# Patient Record
Sex: Female | Born: 1935 | Race: White | Hispanic: No
Health system: Southern US, Community
[De-identification: ages and names within clinical notes are randomized; demographics above are authoritative.]

---

## 2004-05-14 ENCOUNTER — Ambulatory Visit: Payer: Self-pay | Admitting: Radiation Oncology

## 2004-07-27 ENCOUNTER — Ambulatory Visit: Payer: Self-pay | Admitting: Oncology

## 2004-08-14 ENCOUNTER — Ambulatory Visit: Payer: Self-pay | Admitting: Oncology

## 2004-09-29 ENCOUNTER — Ambulatory Visit: Payer: Self-pay | Admitting: Radiation Oncology

## 2004-10-10 ENCOUNTER — Ambulatory Visit: Payer: Self-pay | Admitting: Internal Medicine

## 2004-11-08 ENCOUNTER — Ambulatory Visit: Payer: Self-pay | Admitting: Internal Medicine

## 2004-11-08 ENCOUNTER — Ambulatory Visit: Payer: Self-pay | Admitting: Oncology

## 2004-11-11 ENCOUNTER — Ambulatory Visit: Payer: Self-pay | Admitting: Internal Medicine

## 2004-11-12 ENCOUNTER — Ambulatory Visit: Payer: Self-pay | Admitting: Oncology

## 2005-02-08 ENCOUNTER — Ambulatory Visit: Payer: Self-pay | Admitting: Oncology

## 2005-02-11 ENCOUNTER — Ambulatory Visit: Payer: Self-pay | Admitting: Oncology

## 2005-06-07 ENCOUNTER — Ambulatory Visit: Payer: Self-pay | Admitting: Oncology

## 2005-06-14 ENCOUNTER — Ambulatory Visit: Payer: Self-pay | Admitting: Oncology

## 2005-09-22 ENCOUNTER — Ambulatory Visit: Payer: Self-pay | Admitting: Oncology

## 2005-09-23 ENCOUNTER — Other Ambulatory Visit: Payer: Self-pay

## 2005-09-23 ENCOUNTER — Inpatient Hospital Stay: Payer: Self-pay | Admitting: Psychiatry

## 2005-10-05 ENCOUNTER — Ambulatory Visit: Payer: Self-pay | Admitting: Oncology

## 2005-10-12 ENCOUNTER — Ambulatory Visit: Payer: Self-pay | Admitting: Oncology

## 2005-12-08 ENCOUNTER — Emergency Department: Payer: Self-pay | Admitting: Emergency Medicine

## 2005-12-08 ENCOUNTER — Other Ambulatory Visit: Payer: Self-pay

## 2006-01-22 ENCOUNTER — Ambulatory Visit: Payer: Self-pay | Admitting: Internal Medicine

## 2006-02-22 ENCOUNTER — Ambulatory Visit: Payer: Self-pay | Admitting: Oncology

## 2006-03-14 ENCOUNTER — Ambulatory Visit: Payer: Self-pay | Admitting: Oncology

## 2006-04-25 ENCOUNTER — Ambulatory Visit: Payer: Self-pay | Admitting: Internal Medicine

## 2006-08-21 ENCOUNTER — Ambulatory Visit: Payer: Self-pay | Admitting: Oncology

## 2006-09-14 ENCOUNTER — Ambulatory Visit: Payer: Self-pay | Admitting: Oncology

## 2006-10-16 ENCOUNTER — Other Ambulatory Visit: Payer: Self-pay

## 2006-10-16 ENCOUNTER — Emergency Department: Payer: Self-pay | Admitting: Emergency Medicine

## 2006-10-24 ENCOUNTER — Ambulatory Visit: Payer: Self-pay | Admitting: Psychiatry

## 2006-11-13 ENCOUNTER — Ambulatory Visit: Payer: Self-pay | Admitting: Oncology

## 2006-11-23 ENCOUNTER — Ambulatory Visit: Payer: Self-pay | Admitting: Oncology

## 2006-11-28 ENCOUNTER — Ambulatory Visit: Payer: Self-pay | Admitting: Oncology

## 2006-12-13 ENCOUNTER — Ambulatory Visit: Payer: Self-pay | Admitting: Oncology

## 2007-03-15 ENCOUNTER — Ambulatory Visit: Payer: Self-pay | Admitting: Oncology

## 2007-05-15 ENCOUNTER — Ambulatory Visit: Payer: Self-pay | Admitting: Oncology

## 2007-06-05 ENCOUNTER — Ambulatory Visit: Payer: Self-pay | Admitting: Oncology

## 2007-06-15 ENCOUNTER — Ambulatory Visit: Payer: Self-pay | Admitting: Oncology

## 2007-06-19 ENCOUNTER — Ambulatory Visit: Payer: Self-pay | Admitting: Internal Medicine

## 2007-11-13 ENCOUNTER — Ambulatory Visit: Payer: Self-pay | Admitting: Oncology

## 2007-12-04 ENCOUNTER — Ambulatory Visit: Payer: Self-pay | Admitting: Oncology

## 2007-12-13 ENCOUNTER — Ambulatory Visit: Payer: Self-pay | Admitting: Oncology

## 2008-03-06 ENCOUNTER — Ambulatory Visit: Payer: Self-pay | Admitting: Cardiology

## 2008-03-13 ENCOUNTER — Ambulatory Visit: Payer: Self-pay | Admitting: Internal Medicine

## 2008-05-12 ENCOUNTER — Emergency Department: Payer: Self-pay | Admitting: Emergency Medicine

## 2008-05-12 ENCOUNTER — Other Ambulatory Visit: Payer: Self-pay

## 2008-05-14 ENCOUNTER — Ambulatory Visit: Payer: Self-pay | Admitting: Oncology

## 2008-06-11 ENCOUNTER — Ambulatory Visit: Payer: Self-pay | Admitting: Oncology

## 2008-06-14 ENCOUNTER — Ambulatory Visit: Payer: Self-pay | Admitting: Oncology

## 2009-05-14 ENCOUNTER — Inpatient Hospital Stay: Payer: Self-pay | Admitting: Internal Medicine

## 2009-09-25 ENCOUNTER — Inpatient Hospital Stay: Payer: Self-pay | Admitting: Internal Medicine

## 2010-03-02 ENCOUNTER — Ambulatory Visit: Payer: Self-pay | Admitting: Internal Medicine

## 2010-06-05 ENCOUNTER — Inpatient Hospital Stay: Payer: Self-pay | Admitting: *Deleted

## 2010-09-27 ENCOUNTER — Ambulatory Visit: Payer: Self-pay | Admitting: Internal Medicine

## 2011-09-28 ENCOUNTER — Emergency Department: Payer: Self-pay | Admitting: Emergency Medicine

## 2011-09-28 LAB — CBC
HCT: 38.7 % (ref 35.0–47.0)
HGB: 12.9 g/dL (ref 12.0–16.0)
MCH: 29.8 pg (ref 26.0–34.0)
MCHC: 33.2 g/dL (ref 32.0–36.0)
Platelet: 129 10*3/uL — ABNORMAL LOW (ref 150–440)
WBC: 14.2 10*3/uL — ABNORMAL HIGH (ref 3.6–11.0)

## 2011-09-28 LAB — COMPREHENSIVE METABOLIC PANEL
Alkaline Phosphatase: 78 U/L (ref 50–136)
Anion Gap: 4 — ABNORMAL LOW (ref 7–16)
Bilirubin,Total: 0.8 mg/dL (ref 0.2–1.0)
Calcium, Total: 8.9 mg/dL (ref 8.5–10.1)
Co2: 27 mmol/L (ref 21–32)
Creatinine: 0.95 mg/dL (ref 0.60–1.30)
EGFR (African American): >60
EGFR (Non-African Amer.): 58 — ABNORMAL LOW
Osmolality: 281 (ref 275–301)
Potassium: 4.9 mmol/L (ref 3.5–5.1)
SGOT(AST): 21 U/L (ref 15–37)
SGPT (ALT): 17 U/L

## 2011-09-28 LAB — TROPONIN I: Troponin-I: <0.02 ng/mL

## 2011-09-28 LAB — CK TOTAL AND CKMB (NOT AT ARMC)
CK, Total: 20 U/L — ABNORMAL LOW (ref 21–215)
CK-MB: <0.5 ng/mL — ABNORMAL LOW (ref 0.5–3.6)

## 2011-09-28 LAB — PROTIME-INR: INR: 1.9

## 2011-12-29 ENCOUNTER — Inpatient Hospital Stay: Payer: Self-pay | Admitting: Internal Medicine

## 2011-12-29 LAB — URINALYSIS, COMPLETE
Blood: NEGATIVE
Glucose,UR: NEGATIVE mg/dL (ref 0–75)
Ketone: NEGATIVE
Nitrite: NEGATIVE
Ph: 7 (ref 4.5–8.0)
Protein: NEGATIVE
RBC,UR: NONE SEEN /HPF (ref 0–5)
Specific Gravity: 1.013 (ref 1.003–1.030)
WBC UR: 1 /HPF (ref 0–5)

## 2011-12-29 LAB — CBC
HCT: 38.5 % (ref 35.0–47.0)
HGB: 12.8 g/dL (ref 12.0–16.0)
MCV: 89 fL (ref 80–100)
RDW: 13.9 % (ref 11.5–14.5)
WBC: 6.9 10*3/uL (ref 3.6–11.0)

## 2011-12-29 LAB — COMPREHENSIVE METABOLIC PANEL
Albumin: 3.3 g/dL — ABNORMAL LOW (ref 3.4–5.0)
Alkaline Phosphatase: 106 U/L (ref 50–136)
Anion Gap: 6 — ABNORMAL LOW (ref 7–16)
BUN: 14 mg/dL (ref 7–18)
Chloride: 109 mmol/L — ABNORMAL HIGH (ref 98–107)
Co2: 26 mmol/L (ref 21–32)
EGFR (African American): >60
EGFR (Non-African Amer.): 55 — ABNORMAL LOW
Glucose: 96 mg/dL (ref 65–99)
Osmolality: 282 (ref 275–301)
Potassium: 5.3 mmol/L — ABNORMAL HIGH (ref 3.5–5.1)
SGPT (ALT): 30 U/L
Sodium: 141 mmol/L (ref 136–145)
Total Protein: 6.9 g/dL (ref 6.4–8.2)

## 2011-12-29 LAB — TROPONIN I: Troponin-I: <0.02 ng/mL

## 2011-12-29 LAB — CK TOTAL AND CKMB (NOT AT ARMC): CK-MB: <0.5 ng/mL — ABNORMAL LOW (ref 0.5–3.6)

## 2011-12-29 LAB — PROTIME-INR
INR: 2.4
Prothrombin Time: 26.2 secs — ABNORMAL HIGH (ref 11.5–14.7)

## 2011-12-29 LAB — DIGOXIN LEVEL: Digoxin: 1.26 ng/mL

## 2011-12-29 LAB — PRO B NATRIURETIC PEPTIDE: B-Type Natriuretic Peptide: 3221 pg/mL — ABNORMAL HIGH (ref 0–450)

## 2011-12-29 LAB — LACTATE DEHYDROGENASE: LDH: 178 U/L (ref 84–246)

## 2011-12-30 LAB — CBC WITH DIFFERENTIAL/PLATELET
Eosinophil #: 0.2 10*3/uL (ref 0.0–0.7)
HCT: 33.8 % — ABNORMAL LOW (ref 35.0–47.0)
Lymphocyte %: 23.9 %
MCH: 29.9 pg (ref 26.0–34.0)
MCHC: 33.4 g/dL (ref 32.0–36.0)
MCV: 90 fL (ref 80–100)
Monocyte #: 0.5 x10 3/mm (ref 0.2–0.9)
Neutrophil #: 3.1 10*3/uL (ref 1.4–6.5)
Neutrophil %: 62.3 %
Platelet: 149 10*3/uL — ABNORMAL LOW (ref 150–440)
RBC: 3.78 10*6/uL — ABNORMAL LOW (ref 3.80–5.20)
RDW: 13.4 % (ref 11.5–14.5)
WBC: 4.9 10*3/uL (ref 3.6–11.0)

## 2011-12-30 LAB — COMPREHENSIVE METABOLIC PANEL
Anion Gap: 5 — ABNORMAL LOW (ref 7–16)
BUN: 14 mg/dL (ref 7–18)
Bilirubin,Total: 0.9 mg/dL (ref 0.2–1.0)
Co2: 27 mmol/L (ref 21–32)
EGFR (African American): >60
Glucose: 84 mg/dL (ref 65–99)
Osmolality: 283 (ref 275–301)
Potassium: 5 mmol/L (ref 3.5–5.1)
Sodium: 142 mmol/L (ref 136–145)

## 2011-12-30 LAB — LIPASE, BLOOD: Lipase: 433 U/L — ABNORMAL HIGH (ref 73–393)

## 2011-12-30 LAB — PROTIME-INR
INR: 2.2
Prothrombin Time: 24.4 secs — ABNORMAL HIGH (ref 11.5–14.7)

## 2011-12-31 LAB — BASIC METABOLIC PANEL
Anion Gap: 8 (ref 7–16)
BUN: 12 mg/dL (ref 7–18)
Calcium, Total: 8.2 mg/dL — ABNORMAL LOW (ref 8.5–10.1)
Chloride: 110 mmol/L — ABNORMAL HIGH (ref 98–107)
Creatinine: 0.88 mg/dL (ref 0.60–1.30)
EGFR (African American): >60
Sodium: 143 mmol/L (ref 136–145)

## 2011-12-31 LAB — CBC WITH DIFFERENTIAL/PLATELET
Basophil #: 0.1 10*3/uL (ref 0.0–0.1)
Basophil %: 1 %
Eosinophil #: 0.2 10*3/uL (ref 0.0–0.7)
HCT: 34.3 % — ABNORMAL LOW (ref 35.0–47.0)
HGB: 11.5 g/dL — ABNORMAL LOW (ref 12.0–16.0)
MCHC: 33.6 g/dL (ref 32.0–36.0)
MCV: 92 fL (ref 80–100)
Monocyte #: 0.4 x10 3/mm (ref 0.2–0.9)
Platelet: 158 10*3/uL (ref 150–440)
RBC: 3.72 10*6/uL — ABNORMAL LOW (ref 3.80–5.20)
RDW: 13.7 % (ref 11.5–14.5)
WBC: 5.2 10*3/uL (ref 3.6–11.0)

## 2011-12-31 LAB — LIPASE, BLOOD: Lipase: 118 U/L (ref 73–393)

## 2011-12-31 LAB — LIPID PANEL
Cholesterol: 102 mg/dL (ref 0–200)
HDL Cholesterol: 33 mg/dL — ABNORMAL LOW (ref 40–60)
Ldl Cholesterol, Calc: 56 mg/dL (ref 0–100)
Triglycerides: 65 mg/dL (ref 0–200)

## 2011-12-31 LAB — PROTIME-INR
INR: 2
Prothrombin Time: 22.9 secs — ABNORMAL HIGH (ref 11.5–14.7)

## 2012-01-01 LAB — CEA: CEA: 0.9 ng/mL (ref 0.0–4.7)

## 2012-05-14 DEATH — deceased

## 2013-05-22 IMAGING — CR DG CHEST 1V PORT
1 series · 1 of 1 positions shown · non-contrast
Comparison: none

REASON FOR EXAM: Chest Pain
COMMENTS:

[portable]
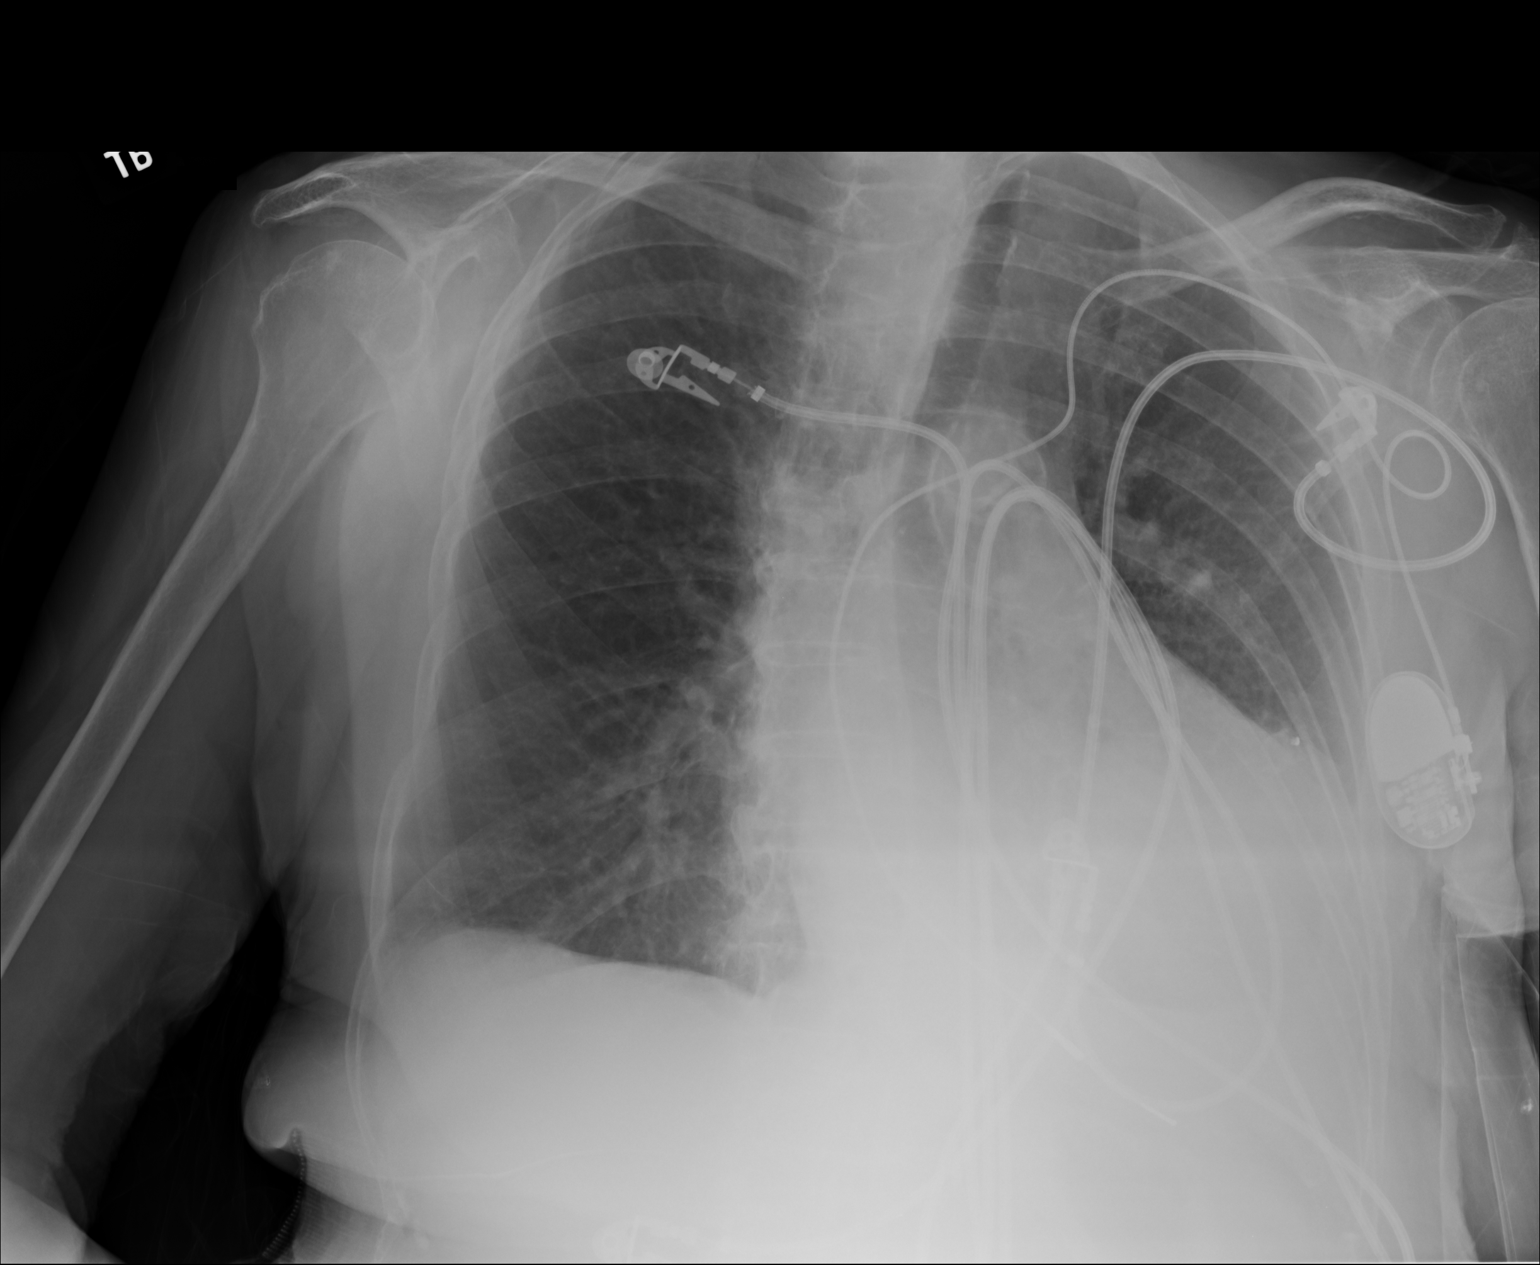

[1 of 1 positions shown; findings below may reference images not displayed]

PROCEDURE:     DXR - DXR PORTABLE CHEST SINGLE VIEW  - December 29, 2011  [DATE]

RESULT:     Comparison is made to the prior exam of the 09/28/2011.

The left hemidiaphragm is indistinct. The possibility of atelectasis or
infiltrate at the left base cannot be entirely excluded on the basis of this
exam. Follow-up is suggested if symptomatology persists. The right lung
field is clear. No acute changes of the pulmonary vasculature are
identified. A cardiac pacemaker is present. The heart size is upper limits
for normal.
IMPRESSION: 1.  There is loss of clarity of the left hemidiaphragm. The possibility of
infiltrate or atelectasis at the left base cannot be excluded. Follow-up PA
and lateral views are recommended if clinically indicated.
2.  The right lung field is clear.
3.  The heart size is upper limits for normal and is stable as compared to
the exam of 09/28/2011.

[REDACTED]

## 2013-05-22 IMAGING — CT CT ABDOMEN W/ CM
1 of 2 series · 15 of 32 positions shown, 19 images · IV contrast (isovue)
Comparison: none

REASON FOR EXAM: epigastric abd pain, new onset pancreatitis, no PO
contrast please
COMMENTS:

PROCEDURE:     CT  - CT ABDOMEN COMPLEX W  - December 29, 2011  [DATE]
RESULT:     Comparison: Right upper quadrant ultrasound performed same day
TECHNIQUE: Multiple axial images of the abdomen were performed from the lung
bases to the iliac crests, without p.o. contrast and with 80 mL of Isovue
300 intravenous contrast.

[Series 2: 3mm soft tissue · axial · 0.66mm/px · z∈[-314,-68]mm · 15 of 92 slices shown, 19 images]
[im 5/92  soft-tissue]
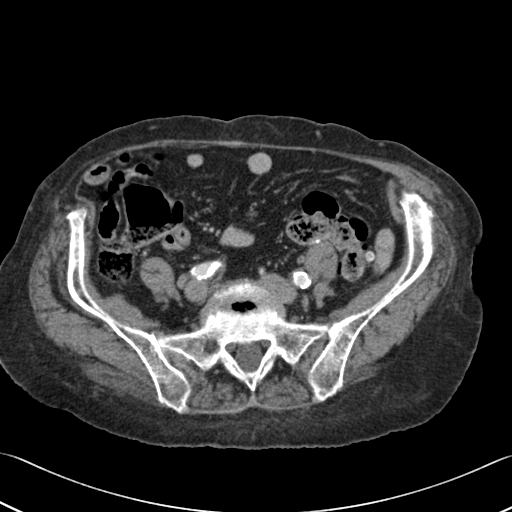
[im 5/92  bone]
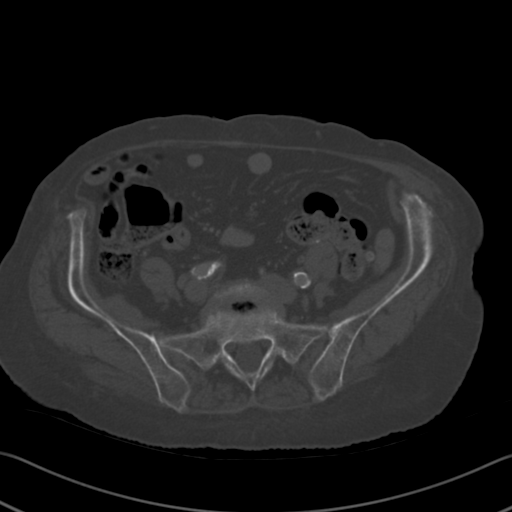
[im 13/92  soft-tissue]
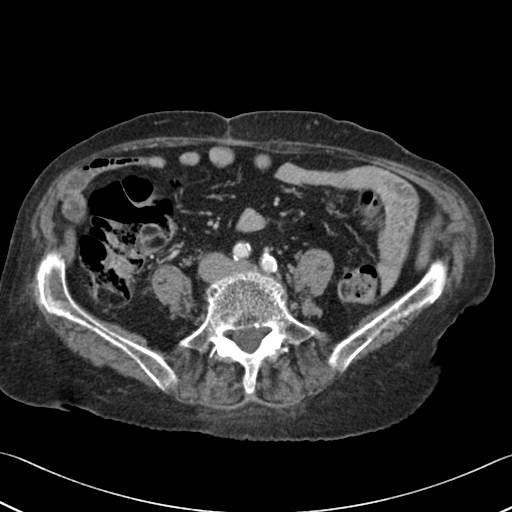
[im 21/92  soft-tissue]
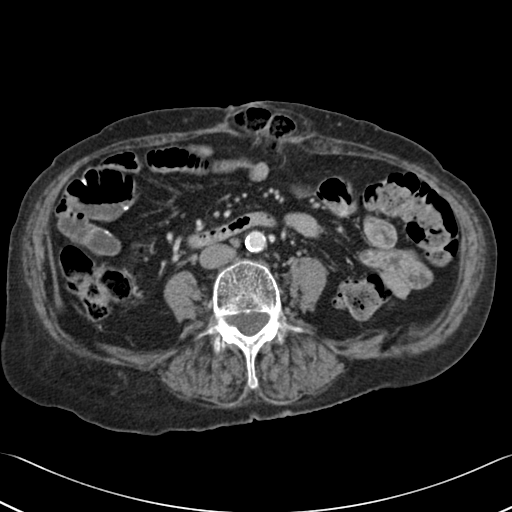
[im 25/92  soft-tissue]
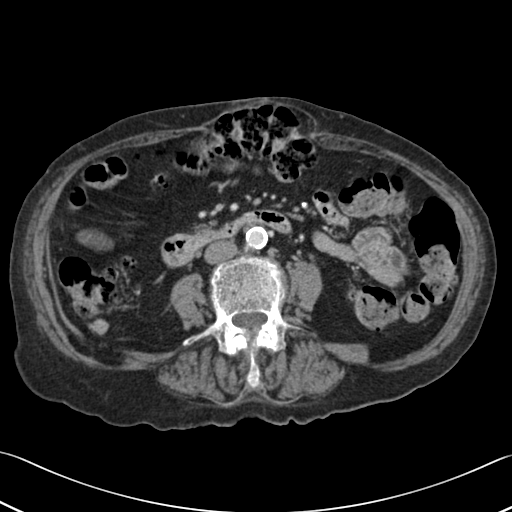
[im 34/92  soft-tissue]
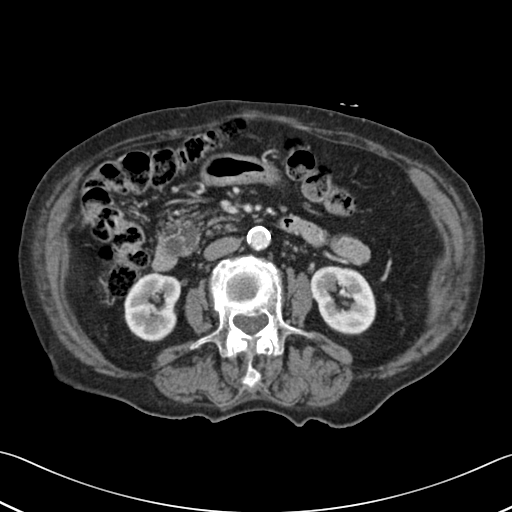
[im 38/92  soft-tissue]
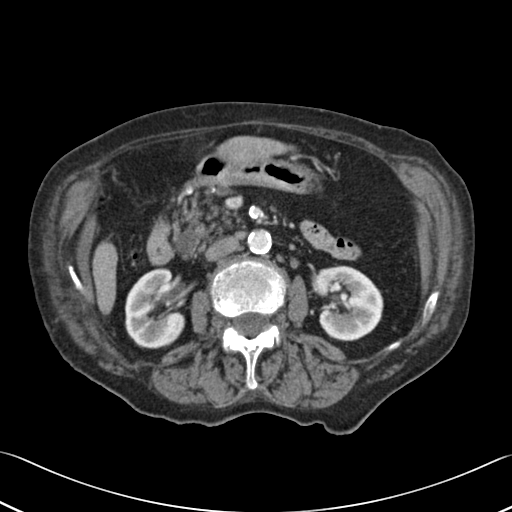
[im 46/92  soft-tissue]
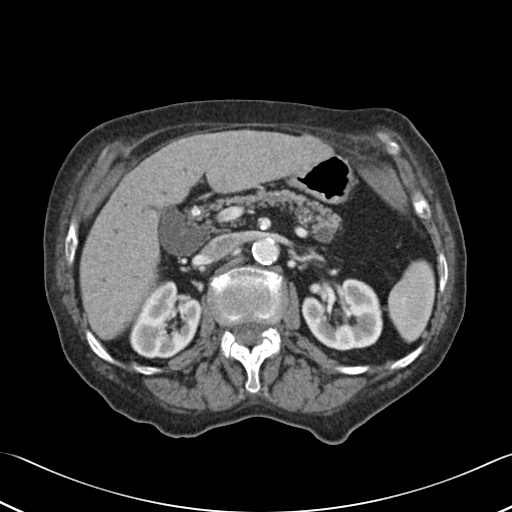
[im 54/92  soft-tissue]
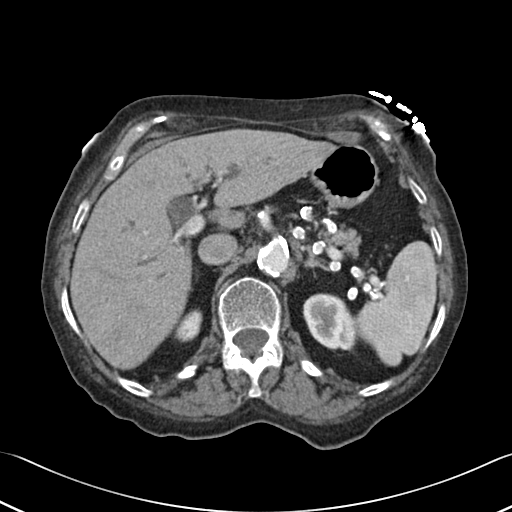
[im 58/92  soft-tissue]
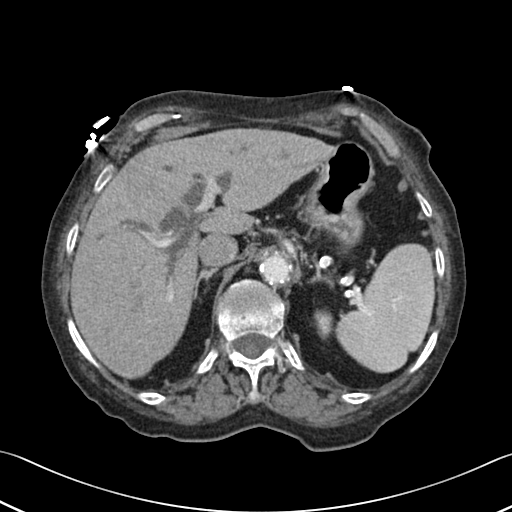
[im 58/92  bone]
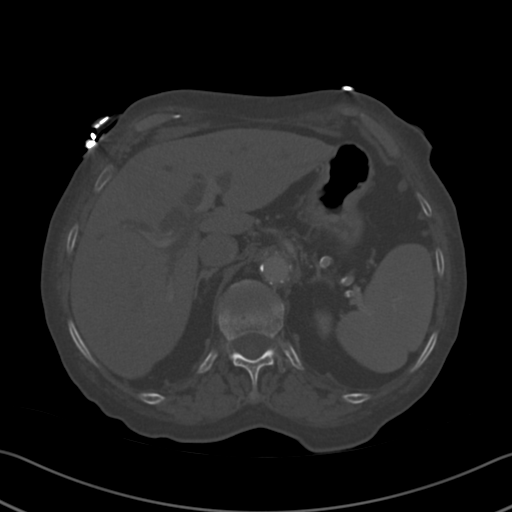
[im 67/92  soft-tissue]
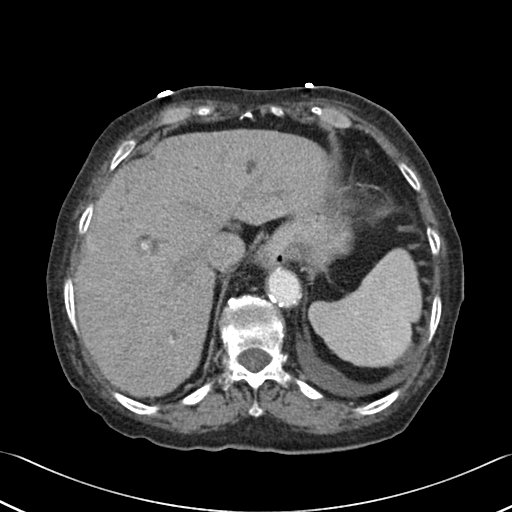
[im 71/92  soft-tissue]
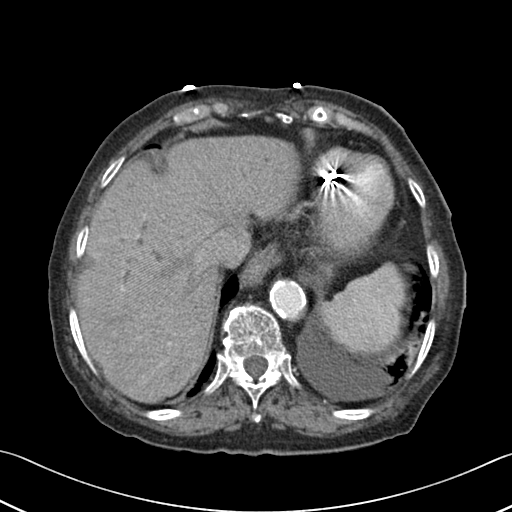
[im 75/92  lung]
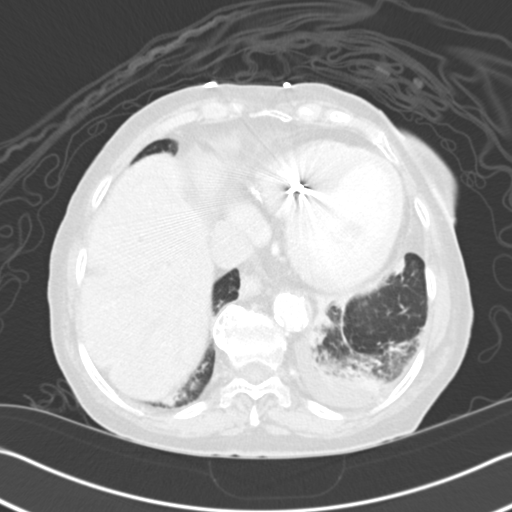
[im 79/92  soft-tissue]
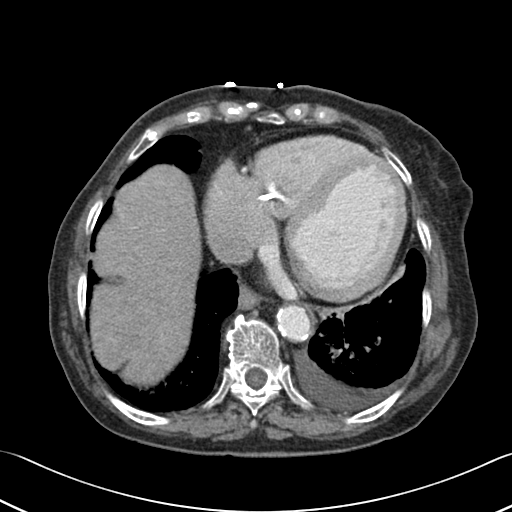
[im 79/92  lung]
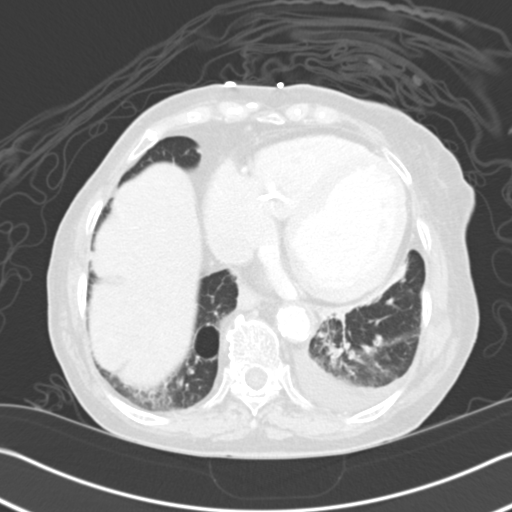
[im 83/92  lung]
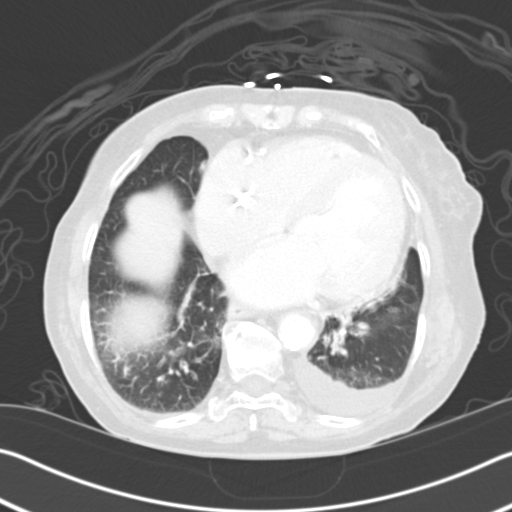
[im 87/92  soft-tissue]
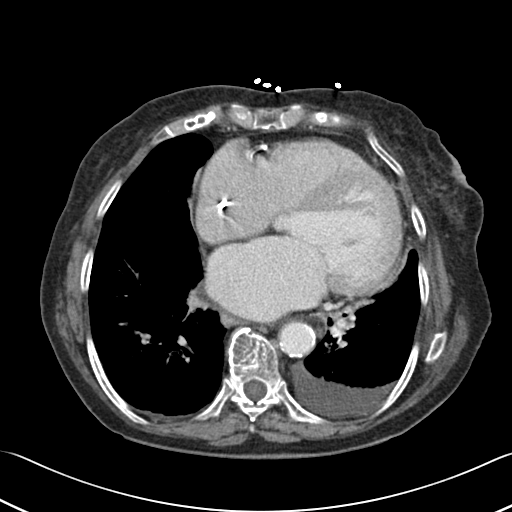
[im 87/92  lung]
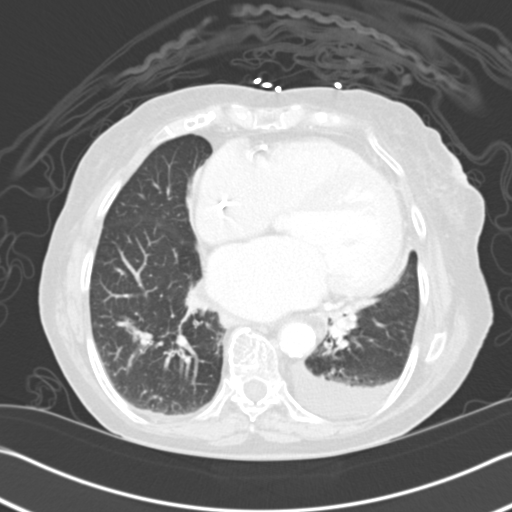

[15 of 32 positions shown; findings below may reference images not displayed]

FINDINGS: There is a small bleb in the medial right lower lobe. Mild basilar opacities
are likely secondary to atelectasis. There is a trace left pleural effusion.

There is intrahepatic biliary ductal dilatation of the right and left
hepatic lobes. The patient is status postcholecystectomy. The common bile
duct is dilated. This is seen to the level of the ampulla. The main
pancreatic duct is mildly dilated to the level of the ampulla as well. A
definite obstructing lesion is not identified. There is a 11 mm cystic
lesion in the tail of the pancreas, as seen on image 48. The pancreas
enhances normally. No significant peripancreatic stranding. The spleen,
adrenals, and kidneys are unremarkable. The visualized small and large bowel
are normal in caliber. There is a broad-based ventral abdominal hernia
containing a portion of the wall of the transverse colon. No evidence of
obstruction. A bowel suture line is seen in the right hemiabdomen.

There is a nonspecific soft tissue nodule in the fat posterior to the
descending colon, which measures 11 mm.

No aggressive lytic or sclerotic osseous lesions are identified.
IMPRESSION: 1. Intrahepatic and extra hepatic biliary ductal dilatation as well as mild
dilatation of the pancreatic duct, seen to the level of the ampulla. A
definite obstructing lesion is not identified. Further violation with ERCP
is suggested.
2. 11 mm cystic lesion in the tail of pancreas. Differential would include a
pancreatic pseudocyst, pancreatic cyst, and a pancreatic cystic neoplasm.
3. Nonspecific 11 mm soft tissue nodule in the fat posterior to the
ascending colon. Differential would include benign and malignant etiologies.

## 2013-05-22 IMAGING — US ABDOMEN ULTRASOUND LIMITED
1 series · 14 of 25 positions shown · non-contrast
Comparison: none

REASON FOR EXAM: epigastric pain, new onset pancreatitis
COMMENTS:   Body Site: GB and Fossa, CBD, Head of Pancreas

PROCEDURE:     US  - US ABDOMEN LIMITED SURVEY  - December 29, 2011  [DATE]
RESULT:     Comparison: None.
TECHNIQUE: Multiple grayscale and color Doppler images were obtained of the
right upper quadrant.

[Series 1: abdomen ultrasound limited · 0.31mm/px · 14 of 50 slices shown]
[im 1/50]
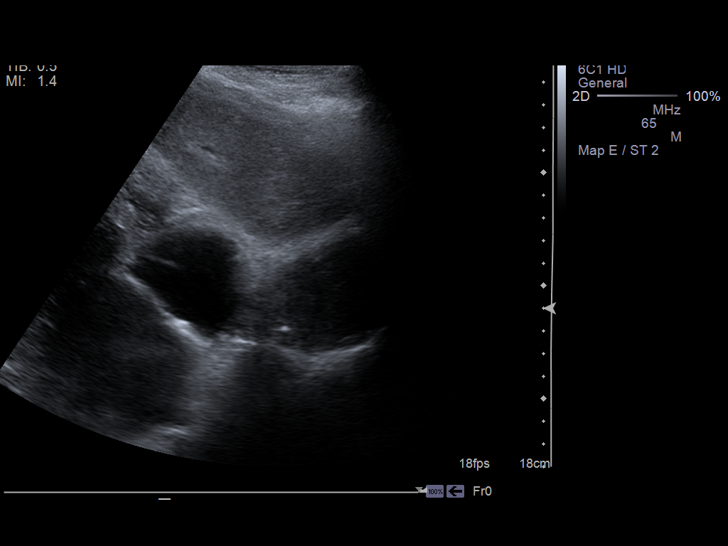
[im 5/50]
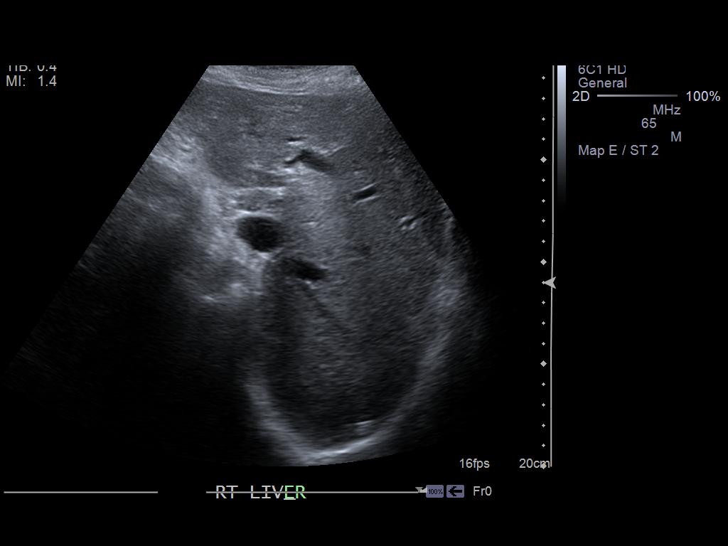
[im 9/50]
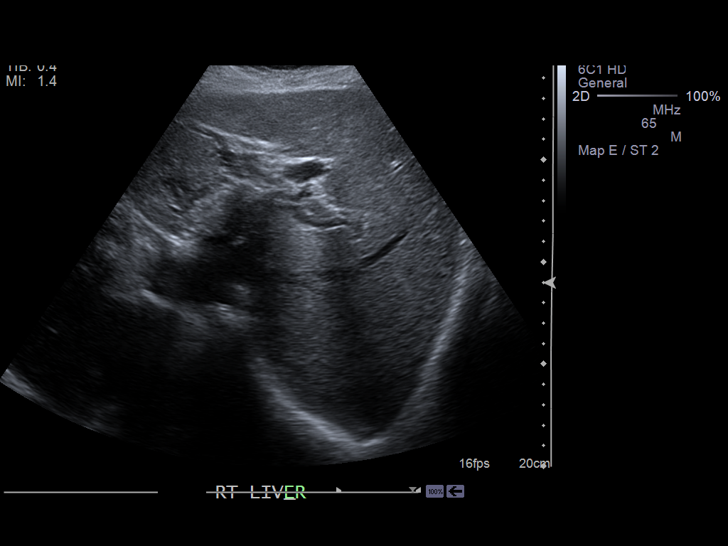
[im 13/50]
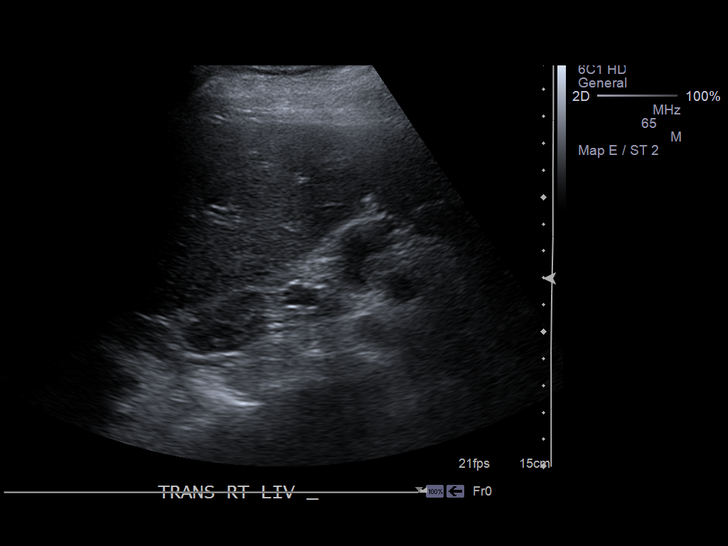
[im 17/50]
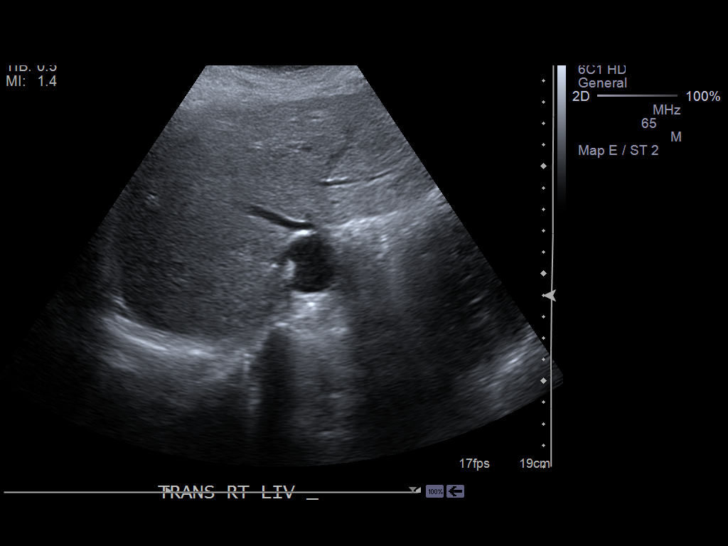
[im 19/50]
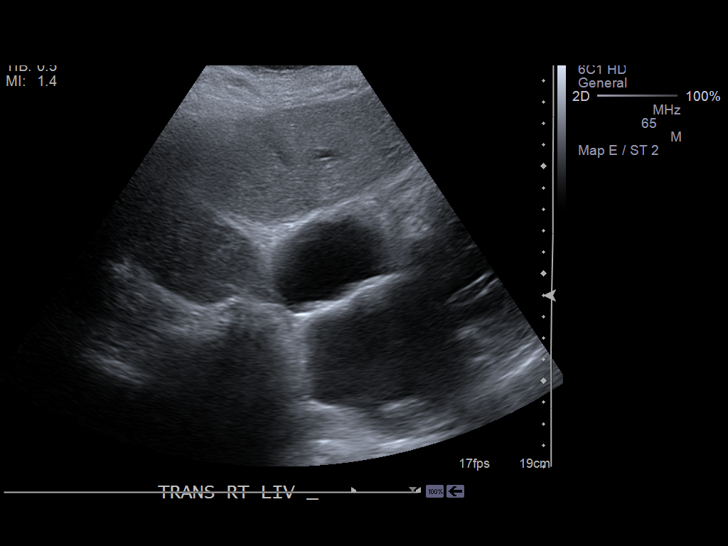
[im 23/50]
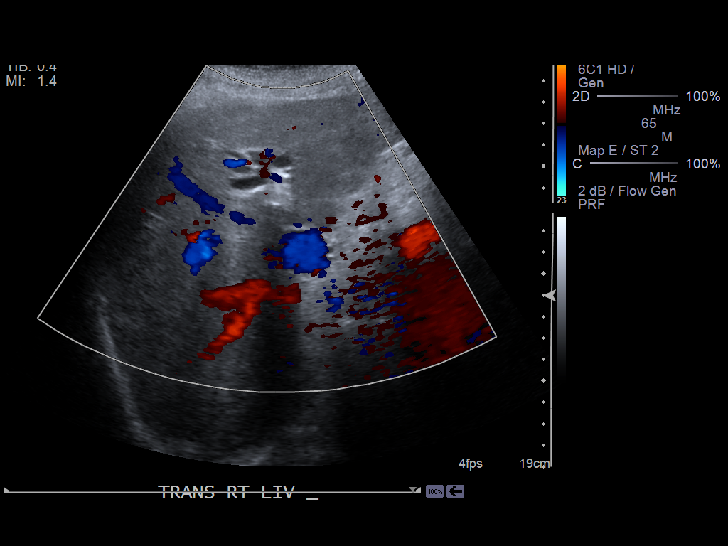
[im 27/50]
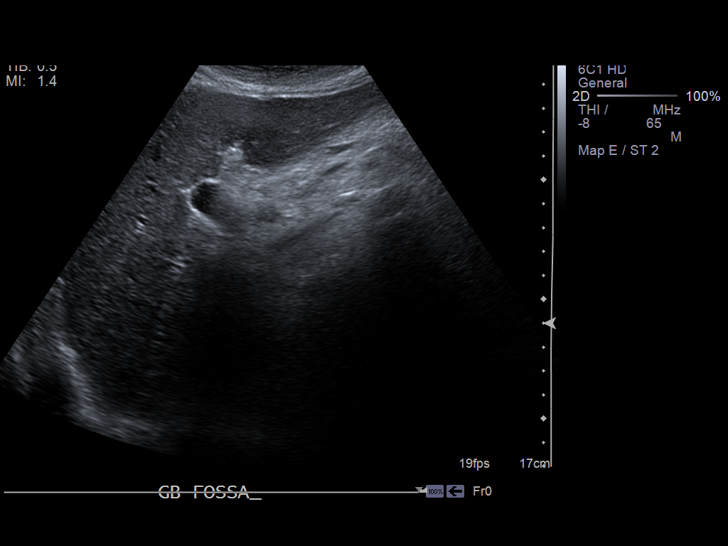
[im 31/50]
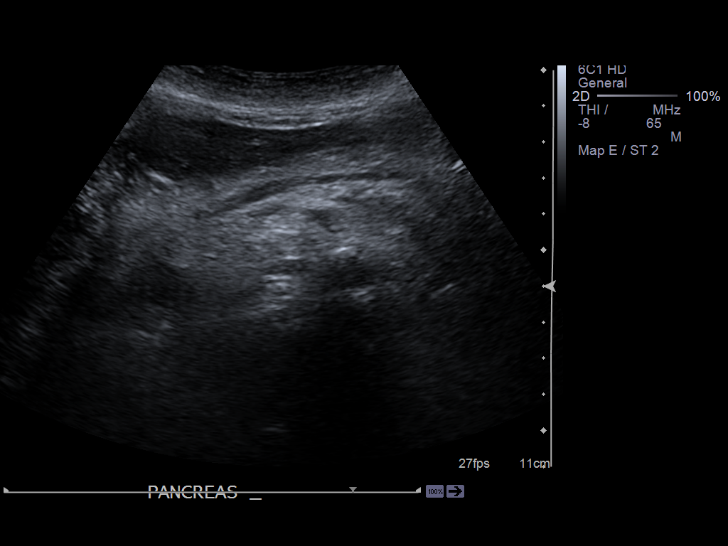
[im 33/50]
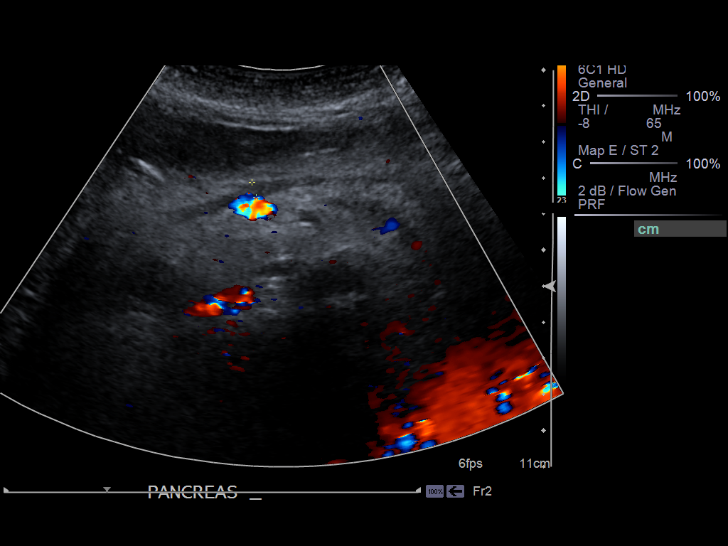
[im 37/50]
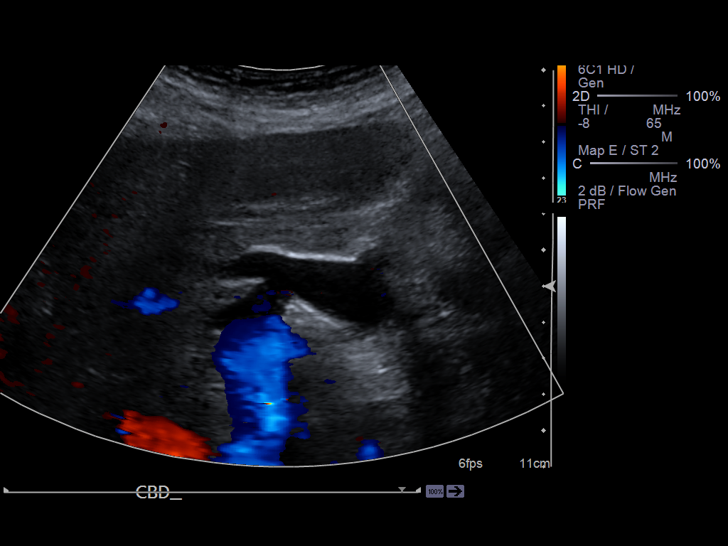
[im 41/50]
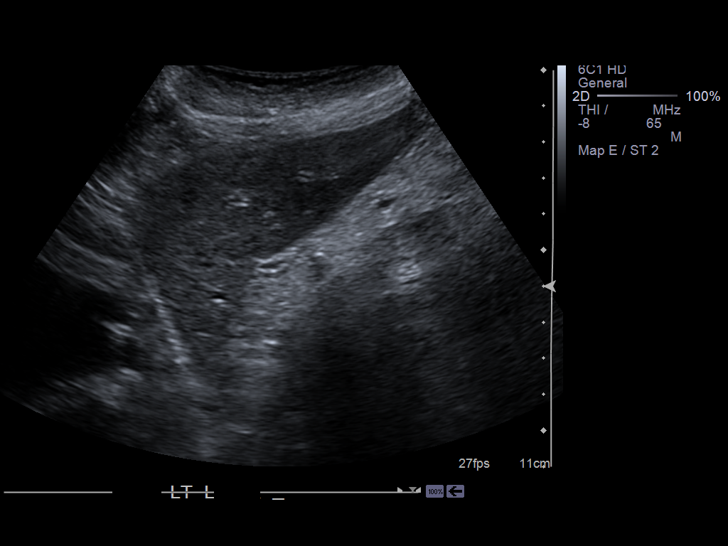
[im 45/50]
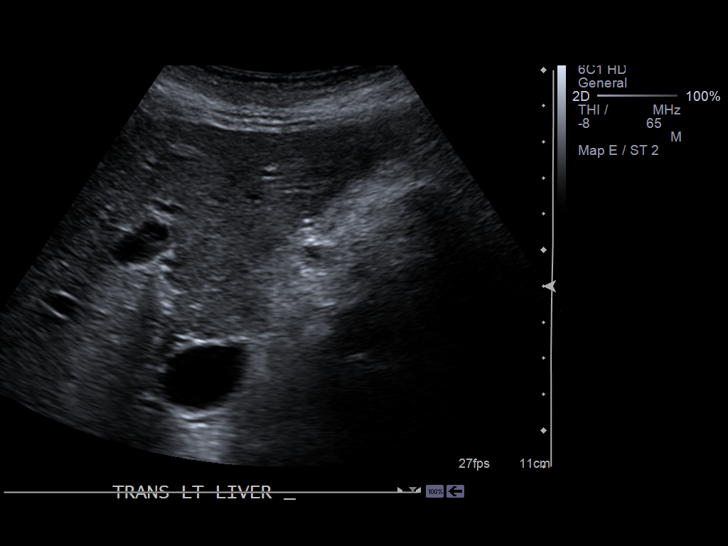
[im 50/50]
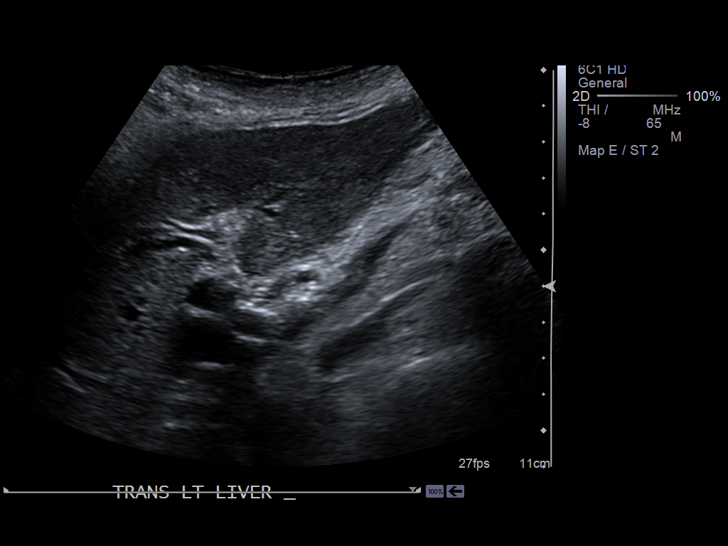

[14 of 25 positions shown; findings below may reference images not displayed]

FINDINGS: The pancreatic duct is enlarged, measuring 4 mm. The common bile duct is
enlarged, measuring 15 mm. There is mild intrahepatic biliary ductal
dilatation. The patient is status post cholecystectomy. The visualized liver
is unremarkable.
IMPRESSION: There is intrahepatic and extra hepatic biliary ductal dilatation as well as
dilatation of the main pancreatic duct. The etiology for this dilatation is
not identified on this study. If there is clinical concern for a mass in the
region of the pancreatic head, further evaluation with contrast-enhanced CT
would be recommended. If there is concern for obstruction by a lesion or
stone within the common bile duct, further elevation with M.R.C.P. or ERCP
would be recommended.

## 2014-12-06 NOTE — Consult Note (Signed)
PATIENT NAME:  Rachel Christian, [name] MR#:  [id number] DATE OF BIRTH:  08/10/1915  DATE OF CONSULTATION:  12/30/2011  REFERRING PHYSICIAN:  Alford Highlandichard Wieting, MD CONSULTING PHYSICIAN:  Lurline DelShaukat Jhoana Upham, MD  REASON FOR CONSULTATION: Acute pancreatitis, abnormal abdominal imaging.   HISTORY OF PRESENT ILLNESS: The patient is a 81 year old female with history of non-Hodgkin's lymphoma which I believe was several years ago, history of colon cancer status post partial colectomy (details are not known), and history of chronic atrial fibrillation on systemic anticoagulation. The patient was recently treated for suspected pneumonia. The patient came to the emergency room yesterday with epigastric pain and right upper quadrant abdominal pain radiating to the back. Blood tests showed a serum lipase of 1800. The patient's BNP was elevated as well. CT scan did not show any signs of acute pancreatitis, although an 11 mm pancreatic cyst was noted along with dilatation of the pancreatic and common bile duct. The patient feels better today. She apparently had a bowel movement this morning and is tolerating a full liquid diet.   PAST MEDICAL HISTORY:  1. Status post history of sick sinus syndrome status post pacemaker implant.  2. Chronic atrial fibrillation.  3. Bilateral hearing loss. 4. History of non-Hodgkin's lymphoma. 5. History of colon cancer. 6. Benign hypertension.  7. Hyperlipidemia.  8. Transient ischemic attacks. 9. Cholecystectomy.   HOME MEDICATIONS: Pro Air, pravastatin, Cozaar, Klor-Con, Lasix, Florastor, Lanoxin, Coumadin, and Coreg.   ALLERGIES: ACE inhibitors and sulfa.   SOCIAL HISTORY: She does not smoke or drink.   FAMILY HISTORY: Positive for hypertension.  REVIEW OF SYSTEMS: Grossly negative except for what is mentioned in the History of Present Illness.   PHYSICAL EXAMINATION:   GENERAL: Elderly, frail-appearing female who does not appear to be in any acute distress. She is quite  awake, alert, and oriented.   HEENT: Grossly unremarkable. No jaundice was noted.   VITAL SIGNS: Temperature 98, pulse 61, respirations 18, and blood pressure 100/61.   LUNGS: Grossly clear to auscultation bilaterally with fair air entry. A few basal crackles were noted.   NECK: Neck veins are somewhat engorged.   CARDIOVASCULAR: Irregular rate and rhythm. No gallops or murmur.   ABDOMEN: There is a medium size periumbilical hernia. Abdomen is slightly distended but is otherwise soft and benign. No hepatosplenomegaly or ascites were noted.   NEUROLOGIC: Examination appears to be grossly nonfocal.   LABS/STUDIES: INR is 2.2. White cell count is 4.9, hemoglobin 11.3, hematocrit 33.8, and platelet count 149. Serum lipase was 1800 on admission and is 433 today. Bilirubin is normal at 0.9, alkaline phosphatase normal at 109, ALT normal at 67, AST slightly elevated at 107, serum albumin is somewhat low at 2.6, BUN is 14, and creatinine is 0.87. Oxygen saturation is about 90%.   CT scan as above.   Ultrasound showed CBD of 15 mm. Intrahepatic biliary dilatation was noted as well. Pancreatic duct is dilated at 4 mm. No definite obstruction or obstructive lesion was noted.   ASSESSMENT AND PLAN: Acute pancreatitis. The patient's lipase was 1800, although CT did not show any signs of significant pancreatic inflammation. The cause of acute pancreatitis is not known.  A pancreatic cyst is seen on CT scan. The patient also has dilatation of the pancreatic duct and common bile duct raising concerns about possible process in the pancreatic head and a malignancy cannot be totally excluded. The patient's biliary tree is dilated, although I doubt an acute biliary obstruction secondary to biliary stone or stricture.  More likely this is a pancreatic process causing dilatation of both pancreatic and biliary tree. The patient's bilirubin and alkaline phosphatase are both normal, which also is against significant  biliary obstruction. A 15 mm common bile duct in a post-cholecystectomy patient who is 81 years old is not unusual either. I would recommend continuing her on a full liquid diet. I would order tumor markers especially for pancreatic cancer including CEA and CA-19-9. The patient would be a rather poor candidate for ERCP, although this determination will be made based on the patient's hospital course. We will follow and make further recommendations.  ____________________________ Lurline Del, MD si:slb D: 12/30/2011 18:39:35 ET T: 12/31/2011 11:23:50 ET JOB#: [id number]  cc: Lurline Del, MD, <Dictator> Lurline Del MD ELECTRONICALLY SIGNED 01/03/2012 9:03

## 2014-12-06 NOTE — Discharge Summary (Signed)
PATIENT NAME:  Rachel Christian, Rachel Christian MR#:  [id number] DATE OF BIRTH:  July 30, 1915  DATE OF ADMISSION:  12/29/2011 DATE OF DISCHARGE:  12/31/2011  PRIMARY CARE PHYSICIAN: Dale Durhamharlene Scott, MD   FINAL DIAGNOSES:  1. Abdominal pain and mild pancreatitis.  2. Dilated ducts on CT scan and a pancreatic cyst.  3. Atrial fibrillation.  4. Hypertension.  5. Recent pneumonia/bronchitis.   MEDICATIONS ON DISCHARGE: 1. The patient can restart her Cefdinir 300 mg twice a day and finish that course.  2. Lasix 40 mg daily.  3. Coreg 6.25 mg twice a day. 4. Digoxin 125 mcg daily.  5. K-Lor 20 mEq daily.  6. Acetaminophen 500 mg daily as needed. 7. Flovent HFA 110 mcg/inhalation 2 puffs every 12 hours.  8. ProAir 2 puffs four times a day.  9. Tessalon Perles 100 mg 3 times a day as needed for cough. 10. Florastor 250 mg daily. 11. Coumadin 1 mg p.o. nightly at 5 p.m. That dose has been decreased.   DO NOT TAKE:  1. Pravastatin. 2. Losartan.   ACTIVITY: As tolerated.   DIET: Low sodium diet.     FOLLOW-UP: 1. Follow-up with Dr. Niel HummerIftikhar, GI, in 2 to 3 weeks. CA-19-9 still pending at the time of discharge.  2. Follow-up with Dr. Dale Durhamharlene Scott in 1 to 2 weeks. Recommend checking PT and INR on Wednesday with Dr. Lorin PicketScott.   REASON FOR ADMISSION: The patient was admitted 12/29/2011, discharged 12/31/2011. The patient was admitted with acute pancreatitis and abdominal pain.   HISTORY OF PRESENT ILLNESS: The patient is a 81 year old female with non-Hodgkin's lymphoma, colon cancer status post partial colectomy, chronic atrial fibrillation on anticoagulation recently being treated as outpatient for pneumonia. In the ER she was noted to have an elevated lipase and CT scan showed ductal dilation and the patient was admitted to the hospital. A GI consultation was obtained. The patient is a DO NOT RESUSCITATE. She was started on antibiotics IV for her recent pneumonia, given nebulizer treatments.   LABORATORY AND  RADIOLOGICAL DATA: Chest x-ray loss of clarity of the left hemidiaphragm, possibility infiltrate versus atelectasis. Right lung is clear.   LDH 188. Lipase 1880. BNP 3221. Digoxin 1.26. INR 2.4. Glucose 96, BUN 14, creatinine 0.89, sodium 141, potassium 5.3, chloride 109, CO2 26, calcium 8.6, AST slightly elevated at 58, albumin 3.3. Other liver function tests normal. White blood cell count 6.9, hemoglobin and hematocrit 12.8 and 38.5, platelet count 197. Troponin negative.  Ultrasound the abdomen showed intrahepatic and extrahepatic biliary ductal dilation as well as dilation of the main pancreatic duct. Etiology of the dilation is not identified. Can consider ERCP or MRCP.   CT scan of the abdomen with contrast shows intra and extrahepatic biliary ductal dilation. Definite obstructing lesion not identified. 11 mm cystic lesion of the tail of the pancreas.   Urinalysis negative. INR 2.2. On the 18th lipase came down to 433.   Repeat chest x-ray on the 185th showed decreased retrocardiac opacity secondary to atelectasis or infection.   LDL 56, HDL 33, triglycerides 65. Lipase on the 19th came down to 118. INR on the 19th 2.0.   HOSPITAL COURSE PER PROBLEM LIST:  1. For the patient's abdominal pain and mild pancreatitis, the patient was given IV fluids, started on liquid diet. The patient's lipase improved rapidly. The patient was advanced on her diet to full liquids and regular diet. She tolerated this without abdominal pain, had no further abdominal pain since the 18th and also on  the 19th. The patient was discharged home with follow-up as outpatient. Unclear what the etiology of this pancreatitis is. Maybe she had a stone that passed. Since she does not have a gallbladder that is out of the equation. The patient is not a drinker. Lipid panel is acceptable. I stopped the pravastatin and losartan just in case that could cause it. Another possibility could be the Lasix I restarted that upon discharge.   2. Dilated ducts on CT scan with a pancreatic cyst. Case discussed with Dr. Niel Hummer. He did not want to do anything invasive at this point. He sent off a CA-19-9 which is still pending. Secondary to the patient's age, I do not know if anything aggressive would be done even if that came back positive. Will follow-up with Dr. Niel Hummer as outpatient.  3. Atrial fibrillation. The patient is anticoagulated with Coumadin. The patient is rate controlled with Coreg and digoxin. I recommend checking an INR on Wednesday.  4. Hypertension. Blood pressure currently stable on Coreg, 133/54. Losartan was held.  5. Recent pneumonia/bronchitis. The patient was on Cefdinir as outpatient switched, over to Zosyn as inpatient and back to Cefdinir to finish up the course as outpatient. The patient's cough has improved.   TIME SPENT ON DISCHARGE: 35 minutes.  ____________________________ Herschell Dimes. Renae Gloss, MD rjw:drc D: 12/31/2011 14:37:50 ET T: 01/01/2012 09:47:50 ET JOB#: [id number]  cc: Herschell Dimes. Renae Gloss, MD, <Dictator> Dale Somersworth, MD Lurline Del, MD Salley Scarlet MD ELECTRONICALLY SIGNED 01/04/2012 13:11

## 2014-12-06 NOTE — H&P (Signed)
PATIENT NAME:  Rachel Christian, Rachel Christian MR#:  [id number] DATE OF BIRTH:  01-Aug-1915  DATE OF ADMISSION:  12/29/2011  REFERRING PHYSICIAN: Dr. Margarita Grizzle in the emergency room.   FAMILY PHYSICIAN:  Dr. Dale Camargito.   REASON FOR ADMISSION: Acute pancreatitis.   HISTORY OF PRESENT ILLNESS: The patient is a 81 year old female with a history of non-Hodgkin's lymphoma, colon cancer status post partial colectomy, and chronic atrial fibrillation on anticoagulation who has been treated recently as an outpatient with Omnicef for pneumonia. Presents to the emergency room now with epigastric and right upper quadrant abdominal pain radiating through to the back. In the emergency room, the patient was noted to have an elevated lipase consistent with acute pancreatitis. CT revealed ductal dilatation with possible obstruction. She is now admitted for further evaluation.   PAST MEDICAL HISTORY:  1. Sick sinus syndrome, status post pacemaker implant.  2. Chronic atrial fibrillation on anticoagulation.  3. Bilateral hearing loss.  4. History of non-Hodgkin's lymphoma.  5. History of colon cancer, status post partial colectomy.  6. Benign hypertension.  7. Hyperlipidemia.  8. History of transient ischemic attacks.  9. Status post cholecystectomy.   MEDICATIONS:  1. ProAir 2 puffs q.4 hours p.r.n. shortness of breath.  2. Pravastatin 10 mg p.o. daily.   3. Cozaar 100 mg p.o. daily.  4. Klor-Con 20 mEq p.o. daily.  5. Lasix 40 mg p.o. daily.  6. Florastor 1 p.o. daily.  7. Lanoxin 0.125 mg p.o. daily.  8. Coumadin 1 mg p.o. at bedtime alternating with 2 mg p.o. at bedtime.  9. Coreg 6.25 mg p.o. b.i.d.   ALLERGIES: ACE inhibitors and sulfa.   SOCIAL HISTORY: Negative for alcohol or tobacco abuse. The patient lives with her daughter.   FAMILY HISTORY: Positive for hypertension, coronary artery disease, and stroke.   REVIEW OF SYSTEMS: CONSTITUTIONAL: No fever or change in weight. EYES: No blurred or double  vision. No glaucoma. ENT: No tinnitus or hearing loss. No nasal discharge or bleeding. No difficulty swallowing. RESPIRATORY: The patient has had some cough but no wheezing. Denies hemoptysis. No painful respiration. No history of tuberculosis. CARDIOVASCULAR: No chest pain or orthopnea. No palpitations or syncope. GI: No nausea, vomiting, or diarrhea. No change in bowel habits. GU: No dysuria or hematuria. No incontinence. ENDOCRINE: No polyuria or polydipsia. No heat or cold intolerance. HEMATOLOGIC: The patient denies anemia, easy bruising, or bleeding. LYMPHATIC: No swollen glands. MUSCULOSKELETAL: The patient denies pain in her neck, shoulders, knees, or hips. Does have back pain. No gout. NEUROLOGIC: No numbness or migraines. Denies stroke or seizures. Does have weakness. PSYCHIATRIC: The patient denies anxiety, insomnia, or depression.   PHYSICAL EXAMINATION:  GENERAL: The patient is elderly, in no acute distress.   VITAL SIGNS: Vital signs remarkable for a blood pressure of 151/62 with a heart rate of 60 and a respiratory rate of 16. She is afebrile.   HEENT: Normocephalic, atraumatic. Pupils equally round and reactive to light and accommodation. Extraocular movements are intact. Sclerae are anicteric. Conjunctivae are clear. Oropharynx is clear.   NECK: Supple without jugular venous distention or bruits. No adenopathy or thyromegaly is noted.   LUNGS: Decreased breath sounds at the left base. No wheezes or rales. There are scattered rhonchi.   CARDIAC: Regular rate and rhythm. Normal S1 and S2. No significant murmurs.   ABDOMEN: Soft but diffusely tender, especially in the epigastrium. Some guarding but no rebound. Normoactive bowel sounds. No organomegaly or mass were appreciated. No hernias or bruits were  noted.   EXTREMITIES: Without clubbing or cyanosis. Trace edema was noted. Pulses are 1+ bilaterally.   SKIN: Warm and dry without rash or lesions.   NEUROLOGIC: Cranial nerves II  through XII grossly intact. Deep tendon reflexes were symmetric. Motor and sensory examination is nonfocal.   PSYCHIATRIC: A patient who was alert and oriented to person, place, and time. She was cooperative and used good judgment.   LABORATORY DATA: EKG revealed a paced rhythm at 65 beats per minute. CT of the abdomen revealed pancreatic ductal dilatation with pancreatic cysts as well as a nonspecific soft tissue nodule in the descending colon. Chest x-ray revealed infiltrate at the left base. Urinalysis was unremarkable. Prothrombin time was 26.2 with an INR of 2.4. CBC revealed a white count of 6.9 with a hemoglobin of 12.8. Chemistries revealed a lipase of 1880 with a BNP of 3221 and a digoxin level of 1.26 with a troponin of less than 0.02. Glucose 96 with a BUN of 14 and creatinine 0.89 with a sodium of 141 and a potassium of 5.3. GFR was 55.   ASSESSMENT:  1. Acute pancreatitis with obstructive process of unclear etiology.  2. Chronic atrial fibrillation, on anticoagulation.  3. Left basilar pneumonia.  4. History of non-Hodgkin's lymphoma.  5. History of colon cancer, status post partial colectomy.  6. Hyperlipidemia.  7. Benign hypertension.   PLAN: The patient will be admitted as a NO CODE BLUE, DO NOT RESUSCITATE according to her wishes. She will be started on IV fluids with empiric IV antibiotics with DuoNeb, SVNs, and oxygen. We will begin a clear liquid diet at this time. We will follow her chemistries including her lipase on a daily basis. We will consult Gastroenterology for possible ERCP and biopsy. We will hold her Coumadin at this time and bridge with Lovenox due to the potential biopsy on ERCP. Follow up routine labs in the morning. Further treatment and evaluation will depend upon the patient's progress.   TOTAL TIME SPENT ON THIS PATIENT: 50 minutes.    ____________________________ Duane LopeJeffrey D. Judithann SheenSparks, MD jds:vtd D: 12/29/2011 20:05:53 ET T: 12/30/2011 06:33:39  ET JOB#: [id number]  cc: Duane LopeJeffrey D. Judithann SheenSparks, MD, <Dictator> Dale Durhamharlene Scott, MD Luciel Brickman Rodena Medin Macen Joslin MD ELECTRONICALLY SIGNED 12/31/2011 13:47

## 2014-12-06 NOTE — Consult Note (Signed)
Chief Complaint:   Subjective/Chief Complaint Feels better. Tolerating diet.   VITAL SIGNS/ANCILLARY NOTES: **Vital Signs.:   19-May-13 10:20   Vital Signs Type Q 4hr   Temperature Temperature (F) 96.9   Celsius 36   Temperature Source axillary   Pulse Pulse 61   Pulse source per Dinamap   Respirations Respirations 20   Systolic BP Systolic BP 703   Diastolic BP (mmHg) Diastolic BP (mmHg) 54   Mean BP 80   BP Source Dinamap   Pulse Ox % Pulse Ox % 94   Pulse Ox Activity Level  At rest   Oxygen Delivery Room Air/ 21 %   Routine Hem:  19-May-13 04:52    WBC (CBC) 5.2   RBC (CBC) 3.72   Hemoglobin (CBC) 11.5   Hematocrit (CBC) 34.3   Platelet Count (CBC) 158   MCV 92   MCH 31.0   MCHC 33.6   RDW 13.7  Routine Chem:  19-May-13 04:52    Glucose, Serum 84   BUN 12   Creatinine (comp) 0.88   Sodium, Serum 143   Potassium, Serum 5.0   Chloride, Serum 110   CO2, Serum 25   Calcium (Total), Serum 8.2   Osmolality (calc) 284   eGFR (African American) >60   eGFR (Non-African American) 56   Anion Gap 8  Routine Coag:  19-May-13 04:52    Prothrombin 22.9   INR 2.0  Routine Chem:  19-May-13 04:52    Lipase 118  Routine Hem:  19-May-13 04:52    Neutrophil % 67.0   Lymphocyte % 20.9   Monocyte % 7.7   Eosinophil % 3.4   Basophil % 1.0   Neutrophil # 3.5   Lymphocyte # 1.1   Monocyte # 0.4   Eosinophil # 0.2   Basophil # 0.1  Routine Chem:  19-May-13 04:52    Cholesterol, Serum 102   Triglycerides, Serum 65   HDL (INHOUSE) 33   VLDL Cholesterol Calculated 13   LDL Cholesterol Calculated 56   Assessment/Plan:  Assessment/Plan:   Assessment Pancreatitis, much better. Lipase normal. Pancreatic cyst. Pancreatic and biliary dilation with normal bilirubin and alkaline phosphatase. Tumor markers pending.    Plan Agree with current management. Will review tumor markers and make further recommendations.   Electronic Signatures: Jill Side (MD)  (Signed  [phone] 10:38)  Authored: Chief Complaint, VITAL SIGNS/ANCILLARY NOTES, Lab Results, Assessment/Plan   Last Updated: 19-May-13 10:38 by Jill Side (MD)
